# Patient Record
Sex: Male | Born: 1990 | Race: White | Hispanic: No | Marital: Single | Smoking: Never smoker
Health system: Southern US, Community
[De-identification: ages and names within clinical notes are randomized; demographics above are authoritative.]

---
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
## 2014-06-15 ENCOUNTER — Ambulatory Visit: Payer: Self-pay | Admitting: Physician Assistant

## 2014-06-25 ENCOUNTER — Ambulatory Visit: Payer: Self-pay | Admitting: Physician Assistant
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
## 2015-08-14 ENCOUNTER — Ambulatory Visit
Admission: EM | Admit: 2015-08-14 | Discharge: 2015-08-14 | Disposition: A | Discharge status: Home / Self Care | Payer: 59 | Attending: Family Medicine | Admitting: Family Medicine

## 2015-08-14 ENCOUNTER — Encounter: Payer: Self-pay | Admitting: *Deleted

## 2015-08-14 DIAGNOSIS — H6092 Unspecified otitis externa, left ear: Secondary | ICD-10-CM | POA: Diagnosis not present

## 2015-08-14 MED ORDER — CIPROFLOXACIN-DEXAMETHASONE 0.3-0.1 % OT SUSP: 4.0000 [drp] | Freq: Two times a day (BID) | OTIC | Status: AC

## 2015-08-14 MED ORDER — CIPROFLOXACIN-DEXAMETHASONE 0.3-0.1 % OT SUSP: 4.0000 [drp] | Freq: Two times a day (BID) | OTIC | Status: DC

## 2015-08-14 NOTE — Discharge Instructions (Signed)
Ear Drops, Adult °You have been diagnosed with a condition requiring you to put drops of medicine into your outer ear. °HOME CARE INSTRUCTIONS  °· Put drops in the affected ear as instructed. After putting the drops in, you will need to lie down with the affected ear facing up for ten minutes so the drops will remain in the ear canal and run down and fill the canal. Continue using the ear drops for as long as directed by your health care provider. °· Prior to getting up, put a cotton ball gently in your ear canal. Leave enough of the cotton ball out so it can be easily removed. Do not attempt to push this down into the canal with a cotton-tipped swab or other instrument. °· Do not irrigate or wash out your ears if you have had a perforated eardrum or mastoid surgery, or unless instructed to do so by your health care provider. °· Keep appointments with your health care provider as instructed. °· Finish all medicine, or use for the length of time prescribed by your health care provider. Continue the drops even if your problem seems to be doing well after a couple days, or continue as instructed. °SEEK MEDICAL CARE IF: °· You become worse or develop increasing pain. °· You notice any unusual drainage from your ear (particularly if the drainage has a bad smell). °· You develop hearing difficulties. °· You experience a serious form of dizziness in which you feel as if the room is spinning, and you feel nauseated (vertigo). °· The outside of your ear becomes red or swollen or both. This may be a sign of an allergic reaction. °MAKE SURE YOU:  °· Understand these instructions. °· Will watch your condition. °· Will get help right away if you are not doing well or get worse. °  °This information is not intended to replace advice given to you by your health care provider. Make sure you discuss any questions you have with your health care provider. °  °Document Released: 03/26/2001 Document Revised: 04/22/2014 Document  Reviewed: 10/27/2012 °Elsevier Interactive Patient Education ©2016 [employer] ° °Otitis Externa °Otitis externa is a bacterial or fungal infection of the outer ear canal. This is the area from the eardrum to the outside of the ear. Otitis externa is sometimes called "swimmer's ear." °CAUSES  °Possible causes of infection include: °· Swimming in dirty water. °· Moisture remaining in the ear after swimming or bathing. °· Mild injury (trauma) to the ear. °· Objects stuck in the ear (foreign body). °· Cuts or scrapes (abrasions) on the outside of the ear. °SIGNS AND SYMPTOMS  °The first symptom of infection is often itching in the ear canal. Later signs and symptoms may include swelling and redness of the ear canal, ear pain, and yellowish-white fluid (pus) coming from the ear. The ear pain may be worse when pulling on the earlobe. °DIAGNOSIS  °Your health care provider will perform a physical exam. A sample of fluid may be taken from the ear and examined for bacteria or fungi. °TREATMENT  °Antibiotic ear drops are often given for 10 to 14 days. Treatment may also include pain medicine or corticosteroids to reduce itching and swelling. °HOME CARE INSTRUCTIONS  °· Apply antibiotic ear drops to the ear canal as prescribed by your health care provider. °· Take medicines only as directed by your health care provider. °· If you have diabetes, follow any additional treatment instructions from your health care provider. °· Keep all follow-up visits   as directed by your health care provider. °PREVENTION  °· Keep your ear dry. Use the corner of a towel to absorb water out of the ear canal after swimming or bathing. °· Avoid scratching or putting objects inside your ear. This can damage the ear canal or remove the protective wax that lines the canal. This makes it easier for bacteria and fungi to grow. °· Avoid swimming in lakes, polluted water, or poorly chlorinated pools. °· You may use ear drops made of rubbing alcohol and  vinegar after swimming. Combine equal parts of white vinegar and alcohol in a bottle. Put 3 or 4 drops into each ear after swimming. °SEEK MEDICAL CARE IF:  °· You have a fever. °· Your ear is still red, swollen, painful, or draining pus after 3 days. °· Your redness, swelling, or pain gets worse. °· You have a severe headache. °· You have redness, swelling, pain, or tenderness in the area behind your ear. °MAKE SURE YOU:  °· Understand these instructions. °· Will watch your condition. °· Will get help right away if you are not doing well or get worse. °  °This information is not intended to replace advice given to you by your health care provider. Make sure you discuss any questions you have with your health care provider. °  °Document Released: 04/01/2005 Document Revised: 04/22/2014 Document Reviewed: 04/18/2011 °Elsevier Interactive Patient Education ©2016 [employer] ° °
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
## 2015-08-14 NOTE — ED Notes (Signed)
Patient is having left side ear pain that started this am. [place] [place] [facility] [place] [place] and is concerned he may have a ear infection. Patient has a history.
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
## 2015-10-02 DIAGNOSIS — H5213 Myopia, bilateral: Secondary | ICD-10-CM | POA: Diagnosis not present

## 2017-02-03 DIAGNOSIS — H5213 Myopia, bilateral: Secondary | ICD-10-CM | POA: Diagnosis not present
